# Patient Record
Sex: Male | Born: 1990 | Race: Black or African American | Hispanic: No | Marital: Single | Smoking: Current every day smoker
Health system: Southern US, Community
[De-identification: ages and names within clinical notes are randomized; demographics above are authoritative.]

## PROBLEM LIST (undated history)

## (undated) DIAGNOSIS — I10 Essential (primary) hypertension: Secondary | ICD-10-CM

---

## 2012-11-24 ENCOUNTER — Encounter (HOSPITAL_COMMUNITY): Payer: Self-pay | Admitting: *Deleted

## 2012-11-24 ENCOUNTER — Emergency Department (INDEPENDENT_AMBULATORY_CARE_PROVIDER_SITE_OTHER)
Admission: EM | Admit: 2012-11-24 | Discharge: 2012-11-24 | Disposition: A | Discharge status: Home / Self Care | Payer: Managed Care, Other (non HMO) | Source: Home / Self Care | Attending: Emergency Medicine | Admitting: Emergency Medicine

## 2012-11-24 DIAGNOSIS — L0501 Pilonidal cyst with abscess: Secondary | ICD-10-CM

## 2012-11-24 DIAGNOSIS — L0591 Pilonidal cyst without abscess: Secondary | ICD-10-CM

## 2012-11-24 HISTORY — DX: Essential (primary) hypertension: I10

## 2012-11-24 MED ORDER — CIPROFLOXACIN HCL 500 MG PO TABS: 500.0000 mg | ORAL_TABLET | Freq: Two times a day (BID) | ORAL | Status: DC

## 2012-11-24 MED ORDER — METRONIDAZOLE 500 MG PO TABS: 500.0000 mg | ORAL_TABLET | Freq: Three times a day (TID) | ORAL | Status: DC

## 2012-11-24 MED ORDER — TRAMADOL HCL 50 MG PO TABS: 100.0000 mg | ORAL_TABLET | Freq: Three times a day (TID) | ORAL | Status: DC | PRN

## 2012-11-24 NOTE — ED Notes (Signed)
Pt    Has   A    abcess  To  Lower  Sacral  Area   That is  Tender  To  The  Touch     Pt  Reports   He  Has  ahistory of htn but takes  No meds

## 2012-11-24 NOTE — ED Provider Notes (Signed)
Chief Complaint  Patient presents with  . Abscess    History of Present Illness:    Dennis Davis is a 22 year old male who has a four-day history of swelling and pain in the sacral area. He has a history of pilonidal cyst that was drained several years ago. He denies any drainage, fever, or chills.  Review of Systems:  Other than noted above, the patient denies any of the following symptoms: Systemic:  No fever, chills or sweats. Skin:  No rash or itching.  PMFSH:  Past medical history, family history, social history, meds, and allergies were reviewed.  No history of diabetes or prior history of abscesses or MRSA.  Physical Exam:   Vital signs:  BP 165/100  Pulse 90  Temp 98.5 F (36.9 C) (Oral)  Resp 20  SpO2 98% Skin:  There is a swollen, tender, raised, fluctuant area over the lower sacrum consistent with an infected pilonidal cyst.  Skin exam was otherwise normal.  No rash. Ext:  Distal pulses were full, patient has full ROM of all joints.  Procedure:  Verbal informed consent was obtained.  The patient was informed of the risks and benefits of the procedure and understands and accepts.  Identity of the patient was verified verbally and by wristband.   The abscess area described above was prepped with Betadine and alcohol and anesthetized with 10 mL of 2% Xylocaine with epinephrine.  Using a #11 scalpel blade, a singe straight incision was made into the area of fluctulence, yielding a large amount of malodorous, prurulent drainage.  Routine cultures were obtained.  Blunt dissection was used to break up loculations and the resulting wound cavity was packed with 1/4 inch Iodoform gauze.  A sterile pressure dressing was applied.  Assessment:  The encounter diagnosis was Pilonidal cyst with abscess.  Plan:   1.  The following meds were prescribed:   New Prescriptions   CIPROFLOXACIN (CIPRO) 500 MG TABLET    Take 1 tablet (500 mg total) by mouth every 12 (twelve) hours.   METRONIDAZOLE  (FLAGYL) 500 MG TABLET    Take 1 tablet (500 mg total) by mouth 3 (three) times daily.   TRAMADOL (ULTRAM) 50 MG TABLET    Take 2 tablets (100 mg total) by mouth every 8 (eight) hours as needed for pain.   2.  The patient was instructed in symptomatic care and handouts were given. 3.  The patient was instructed to leave the dressing in place and return again in 48 hours for packing removal. This was a very large cyst cavity and I think it will need to be repacked again in 48 hours. I advised him to followup with a surgeon for removal of a pilonidal cyst once this has healed up completely. He was given the name of Dr. Avel Peace.   Reuben Likes, MD 11/24/12 2204

## 2012-11-26 ENCOUNTER — Emergency Department (INDEPENDENT_AMBULATORY_CARE_PROVIDER_SITE_OTHER)
Admission: EM | Admit: 2012-11-26 | Discharge: 2012-11-26 | Disposition: A | Discharge status: Home / Self Care | Payer: Managed Care, Other (non HMO) | Source: Home / Self Care | Attending: Emergency Medicine | Admitting: Emergency Medicine

## 2012-11-26 ENCOUNTER — Encounter (HOSPITAL_COMMUNITY): Payer: Self-pay

## 2012-11-26 DIAGNOSIS — L0591 Pilonidal cyst without abscess: Secondary | ICD-10-CM

## 2012-11-26 MED ORDER — CHLORHEXIDINE GLUCONATE 4 % EX LIQD: CUTANEOUS | Status: DC

## 2012-11-26 NOTE — ED Notes (Signed)
Recheck of wound , sacral abcess

## 2012-11-26 NOTE — ED Provider Notes (Signed)
History     CSN: [id number]  Arrival date & time 11/26/12  1556   First MD Initiated Contact with Patient 11/26/12 1642      Chief Complaint  Patient presents with  . Wound Check    (Consider location/radiation/quality/duration/timing/severity/associated sxs/prior treatment) HPI Comments: 22 year old obese, smoker male, nondiabetic. Here for followup after infected pilonidal cyst incision and drainage here 2 days ago. Reports pain is better denies drainage. Taking Cipro and Flagyl with no side effects. Denies rectal pain or difficulty or pain during bowel movements. No fever or chills. Abscess culture preliminary growing few Escherichia coli and rods and few gram-positive in pairs.   Past Medical History  Diagnosis Date  . Hypertension     History reviewed. No pertinent past surgical history.  History reviewed. No pertinent family history.  History  Substance Use Topics  . Smoking status: Current Every Day Smoker  . Smokeless tobacco: Not on file  . Alcohol Use: Yes      Review of Systems  Constitutional: Negative for fever, chills and appetite change.  Skin: Positive for wound.       As per HPI  All other systems reviewed and are negative.    Allergies  Review of patient's allergies indicates no known allergies.  Home Medications   Current Outpatient Rx  Name  Route  Sig  Dispense  Refill  . CHLORHEXIDINE GLUCONATE 4 % EX LIQD      Use weekly as needed.   120 mL   1   . CIPROFLOXACIN HCL 500 MG PO TABS   Oral   Take 1 tablet (500 mg total) by mouth every 12 (twelve) hours.   20 tablet   0   . METRONIDAZOLE 500 MG PO TABS   Oral   Take 1 tablet (500 mg total) by mouth 3 (three) times daily.   30 tablet   0   . TRAMADOL HCL 50 MG PO TABS   Oral   Take 2 tablets (100 mg total) by mouth every 8 (eight) hours as needed for pain.   30 tablet   0     BP 142/95  Pulse 80  Temp 98.1 F (36.7 C) (Oral)  Resp 16  SpO2 100%  Physical Exam   Nursing note and vitals reviewed. Constitutional: He is oriented to person, place, and time.       obese  Cardiovascular: Normal heart sounds.   Pulmonary/Chest: Breath sounds normal.  Neurological: He is alert and oriented to person, place, and time.  Skin:       Pilonidal cyst with surgical vertical opening and packing. No erythema, induration or fluctuation around. No significant tenderness around. Blood tinged drainage after packing removed.     ED Course  Procedures (including critical care time)  Labs Reviewed - No data to display No results found.   1. Pilonidal cyst       MDM  Infected Pilonidal cyst status post incision and drainage 2 days ago appears healing well. No purulent drainage after packing removed. Prescribed Hibiclens soap. Asked patient to complete antibiotics as previuosly prescribed. Surgery referral as needed if recurrent symptoms. Supportive care and red flags that should prompt his return medical attention discussed with patient and provided in writing.         Sharin Grave, MD 11/28/12 [phone]

## 2012-11-27 LAB — CULTURE, ROUTINE-ABSCESS

## 2012-11-28 NOTE — ED Notes (Signed)
Abscess culture: few E.Coli.  Pt. adequately treated with Cipro. Dennis Davis 11/28/2012

## 2018-07-04 ENCOUNTER — Encounter (HOSPITAL_COMMUNITY): Payer: Self-pay

## 2018-07-04 ENCOUNTER — Ambulatory Visit (HOSPITAL_COMMUNITY)
Admission: EM | Admit: 2018-07-04 | Discharge: 2018-07-04 | Disposition: A | Discharge status: Home / Self Care | Payer: Managed Care, Other (non HMO) | Attending: Emergency Medicine | Admitting: Emergency Medicine

## 2018-07-04 DIAGNOSIS — M545 Low back pain, unspecified: Secondary | ICD-10-CM

## 2018-07-04 DIAGNOSIS — Z886 Allergy status to analgesic agent status: Secondary | ICD-10-CM | POA: Diagnosis not present

## 2018-07-04 DIAGNOSIS — F17200 Nicotine dependence, unspecified, uncomplicated: Secondary | ICD-10-CM | POA: Diagnosis not present

## 2018-07-04 DIAGNOSIS — I10 Essential (primary) hypertension: Secondary | ICD-10-CM | POA: Diagnosis not present

## 2018-07-04 DIAGNOSIS — R509 Fever, unspecified: Secondary | ICD-10-CM | POA: Diagnosis not present

## 2018-07-04 DIAGNOSIS — Z8249 Family history of ischemic heart disease and other diseases of the circulatory system: Secondary | ICD-10-CM | POA: Diagnosis not present

## 2018-07-04 DIAGNOSIS — J029 Acute pharyngitis, unspecified: Secondary | ICD-10-CM | POA: Diagnosis not present

## 2018-07-04 LAB — POCT URINALYSIS DIP (DEVICE)
BILIRUBIN URINE: NEGATIVE
GLUCOSE, UA: NEGATIVE mg/dL
Hgb urine dipstick: NEGATIVE
Leukocytes, UA: NEGATIVE
Nitrite: NEGATIVE
Protein, ur: NEGATIVE mg/dL
Urobilinogen, UA: 0.2 mg/dL (ref 0.0–1.0)
pH: 5.5 (ref 5.0–8.0)

## 2018-07-04 LAB — POCT RAPID STREP A: STREPTOCOCCUS, GROUP A SCREEN (DIRECT): NEGATIVE

## 2018-07-04 LAB — POCT INFECTIOUS MONO SCREEN: MONO SCREEN: NEGATIVE

## 2018-07-04 NOTE — Discharge Instructions (Addendum)
We will call you with your other test results.

## 2018-07-04 NOTE — ED Triage Notes (Signed)
Pt presents with sore throat and fever x 3 days.  Pt also complains of lower back pain and pressure while urinating x 1 week.

## 2018-07-04 NOTE — ED Provider Notes (Signed)
[facility]    CSN: [id number] Arrival date & time: 07/04/18  0805     History   Chief Complaint Chief Complaint  Patient presents with  . Sore Throat  . Back Pain    HPI Dennis Davis is a 27 y.o. male.   The history is provided by the patient.  Sore Throat  This is a new problem. The current episode started 2 days ago. The problem occurs constantly. The problem has not changed since onset.Pertinent negatives include no chest pain, no abdominal pain, no headaches and no shortness of breath. Nothing aggravates the symptoms. The symptoms are relieved by ASA. He has tried ASA for the symptoms. The treatment provided mild relief.  Back Pain  Location:  Lumbar spine Quality:  Aching Radiates to:  Does not radiate Pain severity:  No pain Onset quality:  Gradual Duration:  1 week Timing:  Constant Progression:  Improving Chronicity:  New Context: emotional stress, physical stress and recent illness   Context: not falling   Relieved by:  OTC medications Worsened by:  Nothing Associated symptoms: fever   Associated symptoms: no abdominal pain, no bladder incontinence, no bowel incontinence, no chest pain, no headaches, no numbness, no paresthesias, no tingling and no weakness  Dysuria: not really dysuria, but some pressure with urination.     Past Medical History:  Diagnosis Date  . Hypertension     There are no active problems to display for this patient.   History reviewed. No pertinent surgical history.     Home Medications    Prior to Admission medications   Medication Sig Start Date End Date Taking? Authorizing Provider  chlorhexidine (HIBICLENS) 4 % external liquid Use weekly as needed. 11/26/12   Moreno-Coll, Adlih, MD  ciprofloxacin (CIPRO) 500 MG tablet Take 1 tablet (500 mg total) by mouth every 12 (twelve) hours. 11/24/12   Reuben Likes, MD  metroNIDAZOLE (FLAGYL) 500 MG tablet Take 1 tablet (500 mg total) by mouth 3 (three) times daily. 11/24/12    Reuben Likes, MD  traMADol (ULTRAM) 50 MG tablet Take 2 tablets (100 mg total) by mouth every 8 (eight) hours as needed for pain. 11/24/12   Reuben Likes, MD    Family History Family History  Problem Relation Age of Onset  . Hypertension Mother   . Hypertension Father     Social History Social History   Tobacco Use  . Smoking status: Current Every Day Smoker  . Smokeless tobacco: Never Used  Substance Use Topics  . Alcohol use: Not Currently  . Drug use: Yes    Types: Marijuana     Allergies   Ibuprofen   Review of Systems Review of Systems  Constitutional: Positive for fever. Negative for chills.  HENT: Positive for sore throat. Negative for ear pain.   Eyes: Negative for pain and visual disturbance.  Respiratory: Negative for cough and shortness of breath.   Cardiovascular: Negative for chest pain and palpitations.  Gastrointestinal: Negative for abdominal pain, bowel incontinence and vomiting.  Genitourinary: Negative for bladder incontinence, discharge and hematuria. Dysuria: not really dysuria, but some pressure with urination.  Musculoskeletal: Positive for back pain. Negative for arthralgias.  Skin: Negative for color change and rash.  Neurological: Negative for tingling, seizures, syncope, weakness, numbness, headaches and paresthesias.  All other systems reviewed and are negative.    Physical Exam Triage Vital Signs ED Triage Vitals  Enc Vitals Group     BP 07/04/18 0841 (!) 154/98  Pulse Rate 07/04/18 0841 95     Resp 07/04/18 0841 18     Temp 07/04/18 0841 100.1 F (37.8 C)     Temp Source 07/04/18 0841 Oral     SpO2 07/04/18 0841 96 %     Weight --      Height --      Head Circumference --      Peak Flow --      Pain Score 07/04/18 0851 3     Pain Loc --      Pain Edu? --      Excl. in GC? --    No data found.  Updated Vital Signs BP (!) 154/98 (BP Location: Left Arm)   Pulse 95   Temp 100.1 F (37.8 C) (Oral)   Resp 18   SpO2  96%   Visual Acuity Right Eye Distance:   Left Eye Distance:   Bilateral Distance:    Right Eye Near:   Left Eye Near:    Bilateral Near:     Physical Exam  Constitutional: He is oriented to person, place, and time. He appears well-developed and well-nourished.  HENT:  Head: Normocephalic and atraumatic.  Right Ear: Tympanic membrane and ear canal normal.  Left Ear: Tympanic membrane and ear canal normal.  Mouth/Throat: Uvula is midline, oropharynx is clear and moist and mucous membranes are normal. Tonsils are 0 on the right. Tonsils are 0 on the left. No tonsillar exudate.  Eyes: Conjunctivae are normal.  Neck: Neck supple.  Cardiovascular: Normal rate and regular rhythm.  No murmur heard. Pulmonary/Chest: Effort normal and breath sounds normal. No respiratory distress.  Abdominal: Soft. There is no tenderness.  Musculoskeletal: He exhibits no edema.  Mild tenderness to palpation at the right lower lumbar region.  No evidence of costovertebral angle tenderness.  Straight leg raise testing is negative.  Hip range of motion is painless and full.  Lymphadenopathy:    He has no cervical adenopathy.  Neurological: He is alert and oriented to person, place, and time.  Skin: Skin is warm and dry.  Psychiatric: He has a normal mood and affect.  Nursing note and vitals reviewed.    UC Treatments / Results  Labs (all labs ordered are listed, but only abnormal results are displayed) Labs Reviewed  POCT URINALYSIS DIP (DEVICE) - Abnormal; Notable for the following components:      Result Value   Ketones, ur TRACE (*)    All other components within normal limits  CULTURE, GROUP A STREP [facility]  POCT RAPID STREP A  POCT INFECTIOUS MONO SCREEN  URINE CYTOLOGY ANCILLARY ONLY    EKG None  Radiology No results found.  Procedures Procedures (including critical care time)  Medications Ordered in UC Medications - No data to display  Initial Impression / Assessment and Plan /  UC Course  I have reviewed the triage vital signs and the nursing notes.  Pertinent labs & imaging results that were available during my care of the patient were reviewed by me and considered in my medical decision making (see chart for details).  Clinical Course as of Jul 04 950  Fri Jul 04, 2018  0951 Trace ketones  Ketones, ur(!): TRACE [AM]    Clinical Course User Index [AM] Marshell LevanMonroe, Debbrah Sampedro G, MD   The patient likely has a viral illness based on his fever and sore throat.  Back pain is likely musculoskeletal.  STI testing is pending, and we will follow these results.  He is  stable for discharge home with symptomatic treatment. Final Clinical Impressions(s) / UC Diagnoses   Final diagnoses:  Pharyngitis, unspecified etiology  Acute right-sided low back pain without sciatica     Discharge Instructions     We will call you with your other test results.     ED Prescriptions    None     Controlled Substance Prescriptions [place] Controlled Substance Registry consulted? Not Applicable   Marshell Levan, MD 07/04/18 [phone]

## 2018-07-06 LAB — CULTURE, GROUP A STREP (THRC)

## 2018-07-07 ENCOUNTER — Other Ambulatory Visit: Payer: Self-pay

## 2018-07-07 ENCOUNTER — Emergency Department (HOSPITAL_COMMUNITY): Payer: Managed Care, Other (non HMO)

## 2018-07-07 ENCOUNTER — Emergency Department (HOSPITAL_COMMUNITY)
Admission: EM | Admit: 2018-07-07 | Discharge: 2018-07-08 | Disposition: A | Discharge status: Home / Self Care | Payer: Managed Care, Other (non HMO) | Attending: Emergency Medicine | Admitting: Emergency Medicine

## 2018-07-07 DIAGNOSIS — F17200 Nicotine dependence, unspecified, uncomplicated: Secondary | ICD-10-CM | POA: Diagnosis not present

## 2018-07-07 DIAGNOSIS — R0789 Other chest pain: Secondary | ICD-10-CM | POA: Diagnosis present

## 2018-07-07 DIAGNOSIS — J40 Bronchitis, not specified as acute or chronic: Secondary | ICD-10-CM | POA: Insufficient documentation

## 2018-07-07 DIAGNOSIS — I10 Essential (primary) hypertension: Secondary | ICD-10-CM | POA: Insufficient documentation

## 2018-07-07 LAB — URINE CYTOLOGY ANCILLARY ONLY
CHLAMYDIA, DNA PROBE: NEGATIVE
Neisseria Gonorrhea: NEGATIVE
Trichomonas: NEGATIVE

## 2018-07-07 LAB — BASIC METABOLIC PANEL
Anion gap: 11 (ref 5–15)
BUN: 16 mg/dL (ref 6–20)
CHLORIDE: 104 mmol/L (ref 98–111)
CO2: 28 mmol/L (ref 22–32)
CREATININE: 1.43 mg/dL — AB (ref 0.61–1.24)
Calcium: 9.5 mg/dL (ref 8.9–10.3)
GFR calc Af Amer: >60 mL/min (ref >60)
GFR calc non Af Amer: >60 mL/min (ref >60)
Glucose, Bld: 102 mg/dL — ABNORMAL HIGH (ref 70–99)
Potassium: 3.6 mmol/L (ref 3.5–5.1)
SODIUM: 143 mmol/L (ref 135–145)

## 2018-07-07 LAB — CBC
HCT: 41.9 % (ref 39.0–52.0)
Hemoglobin: 13.7 g/dL (ref 13.0–17.0)
MCH: 31.8 pg (ref 26.0–34.0)
MCHC: 32.7 g/dL (ref 30.0–36.0)
MCV: 97.2 fL (ref 78.0–100.0)
PLATELETS: 229 10*3/uL (ref 150–400)
RBC: 4.31 MIL/uL (ref 4.22–5.81)
RDW: 11.5 % (ref 11.5–15.5)
WBC: 5.9 10*3/uL (ref 4.0–10.5)

## 2018-07-07 LAB — I-STAT TROPONIN, ED: Troponin i, poc: 0 ng/mL (ref 0.00–0.08)

## 2018-07-07 NOTE — ED Notes (Signed)
No answer for room X2 

## 2018-07-07 NOTE — ED Triage Notes (Signed)
Patient c/o CP, loss of appetite,chills, and fever for 4-5 days.

## 2018-07-07 NOTE — ED Notes (Signed)
No answer for room 3X

## 2018-07-07 NOTE — ED Notes (Signed)
No Answer for room 1x

## 2018-07-08 MED ORDER — AZITHROMYCIN 250 MG PO TABS: 250.0000 mg | ORAL_TABLET | Freq: Every day | ORAL | 0 refills | Status: AC

## 2018-07-08 MED ORDER — RANITIDINE HCL 150 MG PO TABS: 150.0000 mg | ORAL_TABLET | Freq: Two times a day (BID) | ORAL | 0 refills | Status: AC

## 2018-07-08 NOTE — ED Provider Notes (Signed)
[facility] Provider Note   CSN: [id number] Arrival date & time: 07/07/18  2148     History   Chief Complaint Chief Complaint  Patient presents with  . Chest Pain    HPI Dennis KettleVictor L. Dennis HeapsGist Jr. is a 27 y.o. male.  27 year old male presents with complaint of generally not feeling well.  Patient states that this started about a week ago on Sunday when he noticed low back discomfort with some epigastric discomfort, nausea, substernal burning, especially after eating.  Epigastric abdominal pain and substernal chest discomfort worse after eating, states he is belching frequently.  Patient denies complaints of shortness of breath.  Patient went to urgent care 4 days ago with a complaint of sore throat had STD testing completed well at that time, negative for STDs including gonorrhea, chlamydia, trichomonas.  Rapid strep and mono tests were negative, patient was discharged with viral illness likely.     Past Medical History:  Diagnosis Date  . Hypertension     There are no active problems to display for this patient.   No past surgical history on file.      Home Medications    Prior to Admission medications   Medication Sig Start Date End Date Taking? Authorizing Provider  azithromycin (ZITHROMAX) 250 MG tablet Take 1 tablet (250 mg total) by mouth daily. Take first 2 tablets together, then 1 every day until finished. 07/08/18   Jeannie FendMurphy, Laura A, PA-C  ranitidine (ZANTAC) 150 MG tablet Take 1 tablet (150 mg total) by mouth 2 (two) times daily. 07/08/18   Jeannie FendMurphy, Laura A, PA-C    Family History Family History  Problem Relation Age of Onset  . Hypertension Mother   . Hypertension Father     Social History Social History   Tobacco Use  . Smoking status: Current Every Day Smoker  . Smokeless tobacco: Never Used  Substance Use Topics  . Alcohol use: Not Currently  . Drug use: Yes    Types: Marijuana     Allergies   Advil  [ibuprofen]   Review of Systems Review of Systems  Constitutional: Positive for chills and fever.       Fever 100.1 oral at urgent care  HENT: Negative for congestion, sinus pressure, sinus pain, sneezing and sore throat.   Respiratory: Negative for chest tightness and shortness of breath.   Cardiovascular: Positive for chest pain.  Gastrointestinal: Positive for abdominal pain, constipation and nausea. Negative for diarrhea and vomiting.  Genitourinary: Positive for frequency and urgency. Negative for difficulty urinating and dysuria.  Musculoskeletal: Positive for back pain. Negative for gait problem, joint swelling and neck pain.  Skin: Negative for rash and wound.  Allergic/Immunologic: Negative for immunocompromised state.  Neurological: Positive for headaches. Negative for dizziness and weakness.  Hematological: Negative for adenopathy. Does not bruise/bleed easily.  Psychiatric/Behavioral: Positive for sleep disturbance.  All other systems reviewed and are negative.    Physical Exam Updated Vital Signs BP (!) 152/105 (BP Location: Right Arm)   Pulse 94   Temp 99.9 F (37.7 C) (Oral)   Resp 15   Wt (!) 149.7 kg   SpO2 98%   Physical Exam  Constitutional: He is oriented to person, place, and time. He appears well-developed and well-nourished. No distress.  HENT:  Head: Normocephalic and atraumatic.  Right Ear: External ear normal.  Left Ear: External ear normal.  Nose: Nose normal.  Mouth/Throat: Oropharynx is clear and moist. No oropharyngeal exudate.  Eyes: Conjunctivae are  normal.  Neck: Neck supple.  Cardiovascular: Normal rate, regular rhythm, normal heart sounds and intact distal pulses.  No murmur heard. Pulmonary/Chest: Effort normal and breath sounds normal. No respiratory distress. He exhibits no tenderness.  Abdominal: Soft. There is no tenderness.  Musculoskeletal: Normal range of motion. He exhibits no tenderness.  Lymphadenopathy:    He has no  cervical adenopathy.  Neurological: He is alert and oriented to person, place, and time.  Skin: Skin is warm and dry. He is not diaphoretic.  Psychiatric: He has a normal mood and affect. His behavior is normal.  Nursing note and vitals reviewed.    ED Treatments / Results  Labs (all labs ordered are listed, but only abnormal results are displayed) Labs Reviewed  BASIC METABOLIC PANEL - Abnormal; Notable for the following components:      Result Value   Glucose, Bld 102 (*)    Creatinine, Ser 1.43 (*)    All other components within normal limits  CBC  I-STAT TROPONIN, ED    EKG EKG Interpretation  Date/Time:  Monday July 07 2018 22:44:59 EDT Ventricular Rate:  97 PR Interval:  150 QRS Duration: 90 QT Interval:  340 QTC Calculation: 431 R Axis:   67 Text Interpretation:  Normal sinus rhythm T wave abnormality, consider inferior ischemia Abnormal ECG No previous ECGs available Confirmed by Zadie Rhine ([id number]) on 07/08/2018 12:45:12 AM   Radiology Dg Chest 2 View  Result Date: 07/07/2018 CLINICAL DATA:  Initial evaluation for acute midsternal chest pain, cough, fever. EXAM: CHEST - 2 VIEW COMPARISON:  None. FINDINGS: Cardiac and mediastinal silhouettes are within normal limits. Lungs normally inflated. Mild scattered peribronchial thickening. No consolidative airspace disease. No pulmonary edema or pleural effusion. No pneumothorax. No acute osseous abnormality. IMPRESSION: Mild scattered peribronchial thickening, nonspecific, but could reflect sequelae of acute bronchiolitis given provided history of cough. No focal infiltrates to suggest pneumonia. Electronically Signed   By: Rise Mu M.D.   On: 07/07/2018 23:15    Procedures Procedures (including critical care time)  Medications Ordered in ED Medications - No data to display   Initial Impression / Assessment and Plan / ED Course  I have reviewed the triage vital signs and the nursing  notes.  Pertinent labs & imaging results that were available during my care of the patient were reviewed by me and considered in my medical decision making (see chart for details).  Clinical Course as of Jul 08 100  Tue Jul 08, 2018  3235 27 year old well-appearing male with various complaints including epigastric and substernal discomfort, concern for low-grade fevers, low back pain.  Exam is unremarkable, patient was seen at urgent care 4 days ago, I have reviewed that chart labs completed that day including a rapid strep, mono, urine STD panel were all normal.  Patient's blood pressure is elevated, it was elevated as urgent care visit as well, was discussed with patient, advised him to monitor his blood pressure and follow-up.  Patient plans to try diet and exercise first to lose weight.  Patient denies sick contacts, recent travel, tick bites.  Case was discussed with Dr. Bebe Shaggy, ER attending who has reviewed the chart, EKG and seen the patient.  Lab work today shows a mildly elevated creatinine at 1.43, CBC normal, troponin normal, chest x-ray with question of bronchitis.  Patient will be placed on antibiotic, also recommend Zantac for his complaints which may be GERD related.  And recommend follow-up with PCP, referral given.   [LM]  Clinical Course User Index [LM] Jeannie Fend, PA-C    Final Clinical Impressions(s) / ED Diagnoses   Final diagnoses:  Bronchitis  Hypertension, unspecified type    ED Discharge Orders         Ordered    azithromycin (ZITHROMAX) 250 MG tablet  Daily     07/08/18 0055    ranitidine (ZANTAC) 150 MG tablet  2 times daily     07/08/18 0055           Jeannie Fend, PA-C 07/08/18 0101    Zadie Rhine, MD 07/08/18 [phone]

## 2018-07-08 NOTE — Discharge Instructions (Addendum)
Monitor your blood pressure, record the readings and take to PCP for follow-up. Take Zithromax and Zantac as prescribed and complete the full course. Come to ER for worsening or concerning symptoms.

## 2019-04-19 IMAGING — CR DG CHEST 2V
2 series · 2 of 2 positions shown · non-contrast
Comparison: None.

CLINICAL DATA: Initial evaluation for acute midsternal chest pain,
cough, fever.

EXAM:
CHEST - 2 VIEW

[chest pa]
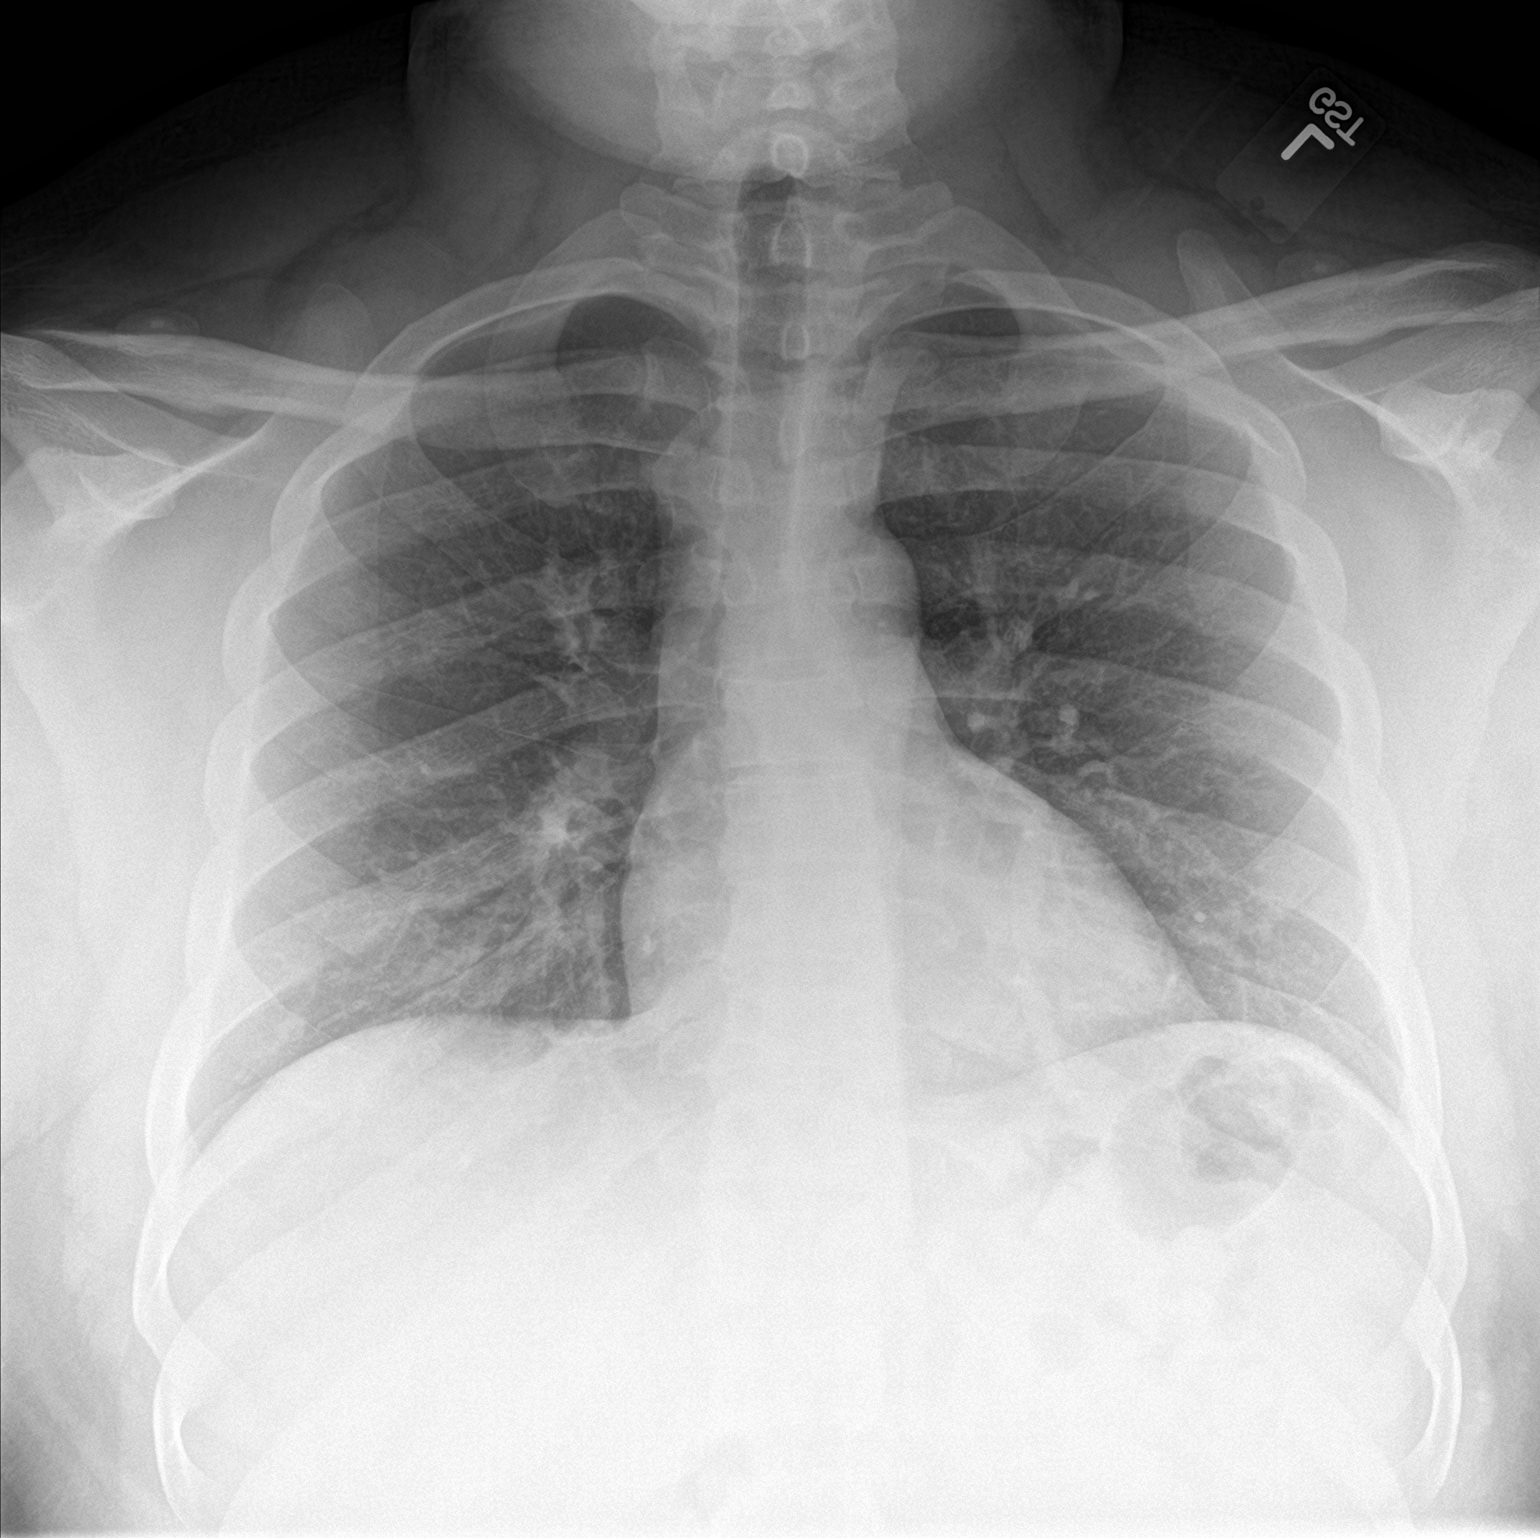

[chest lat]
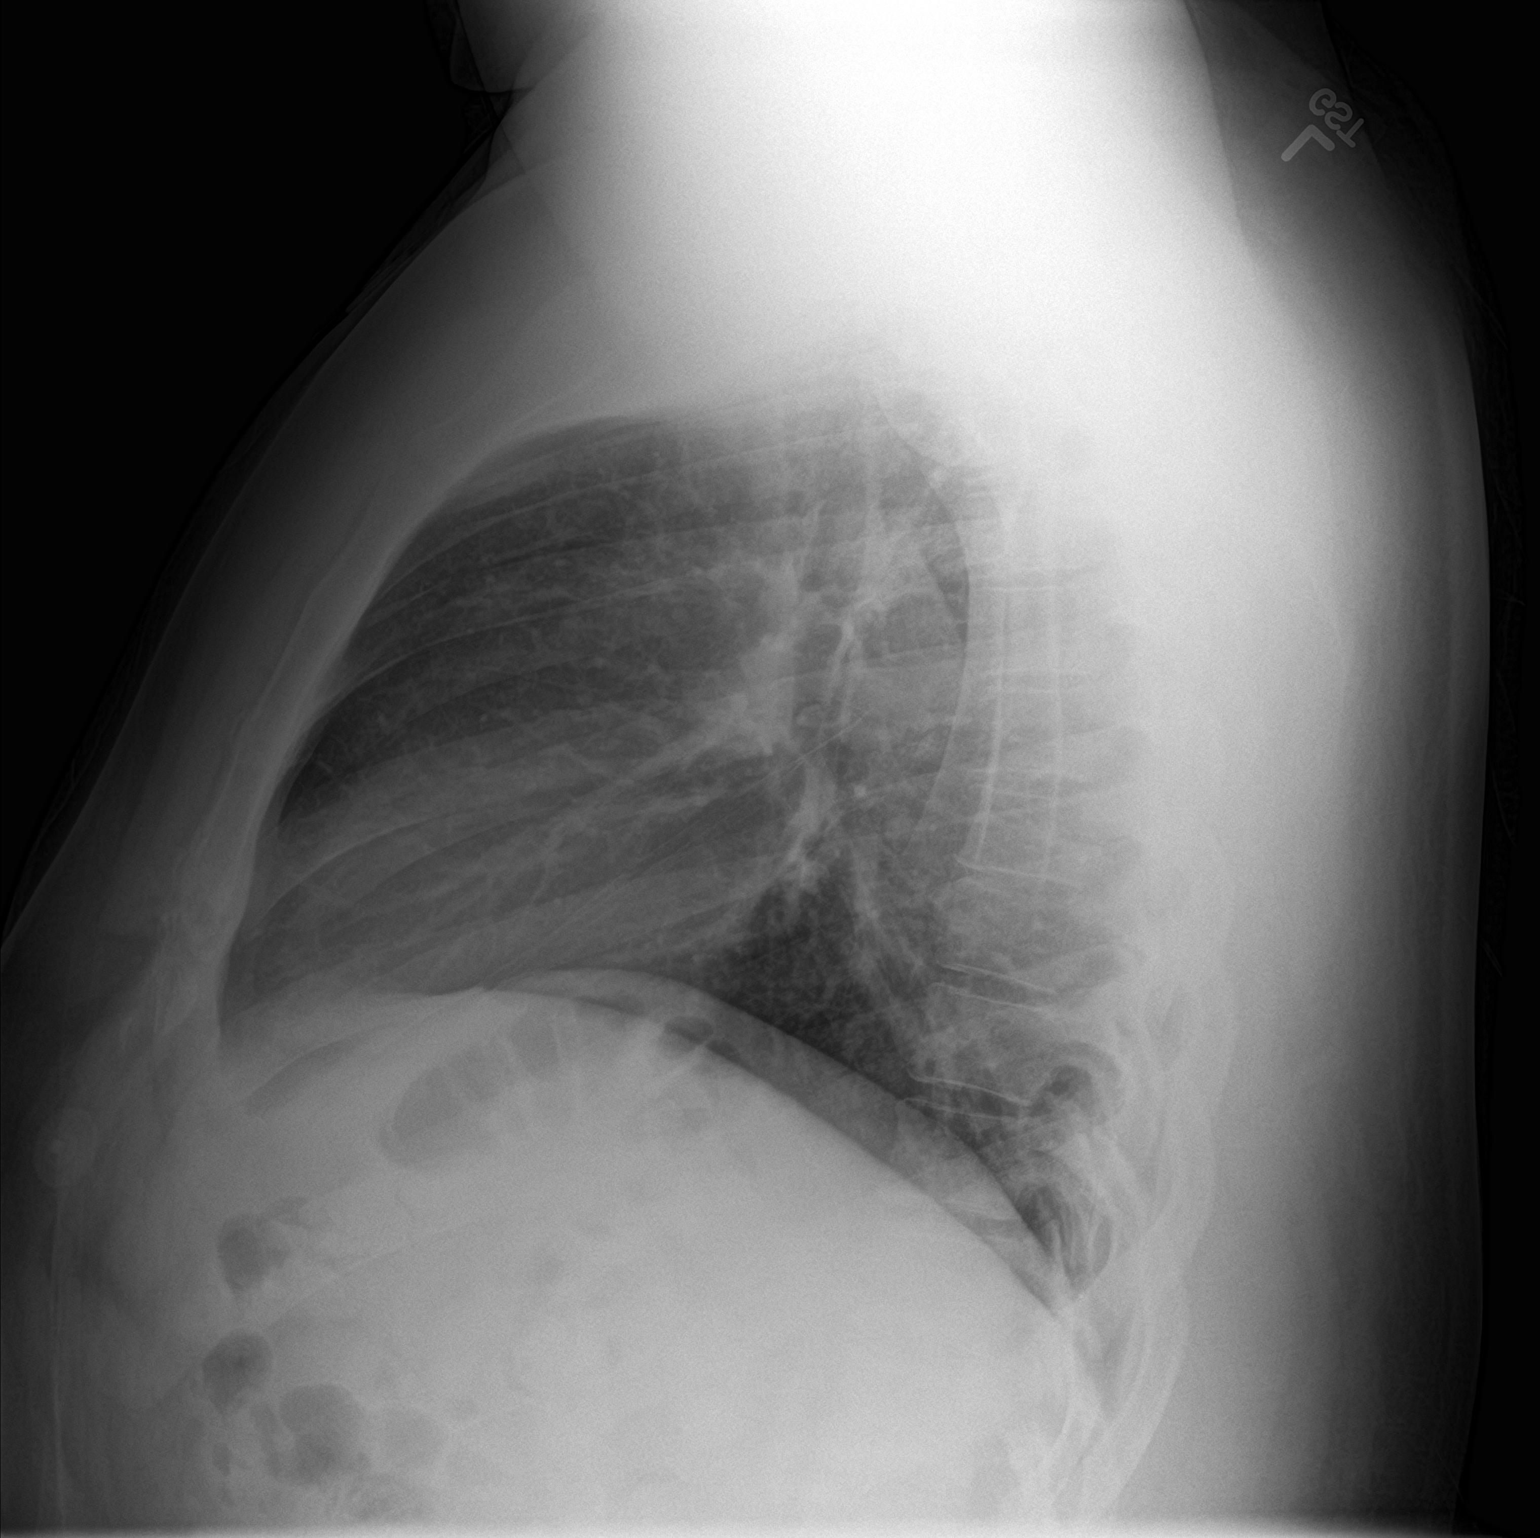

[2 of 2 positions shown; findings below may reference images not displayed]

FINDINGS: Cardiac and mediastinal silhouettes are within normal limits.

Lungs normally inflated. Mild scattered peribronchial thickening. No
consolidative airspace disease. No pulmonary edema or pleural
effusion. No pneumothorax.

No acute osseous abnormality.
IMPRESSION: Mild scattered peribronchial thickening, nonspecific, but could
reflect sequelae of acute bronchiolitis given provided history of
cough. No focal infiltrates to suggest pneumonia.
